# Patient Record
Sex: Female | Born: 1995 | Race: White | Hispanic: No | Marital: Single | Smoking: Never smoker
Health system: Southern US, Community
[De-identification: ages and names within clinical notes are randomized; demographics above are authoritative.]

## PROBLEM LIST (undated history)

## (undated) DIAGNOSIS — L709 Acne, unspecified: Secondary | ICD-10-CM

## (undated) HISTORY — DX: Acne, unspecified: L70.9

## (undated) HISTORY — PX: NO PAST SURGERIES: SHX2092

---
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
## 2017-03-19 ENCOUNTER — Ambulatory Visit (INDEPENDENT_AMBULATORY_CARE_PROVIDER_SITE_OTHER): Payer: BLUE CROSS/BLUE SHIELD | Admitting: Family Medicine

## 2017-03-19 ENCOUNTER — Encounter: Payer: Self-pay | Admitting: Family Medicine

## 2017-03-19 VITALS — BP 113/65 | HR 66 | Temp 98.2°F | Resp 14

## 2017-03-19 DIAGNOSIS — R110 Nausea: Secondary | ICD-10-CM

## 2017-03-19 DIAGNOSIS — J029 Acute pharyngitis, unspecified: Secondary | ICD-10-CM

## 2017-03-19 LAB — POCT INFLUENZA A/B
INFLUENZA B, POC: NEGATIVE
Influenza A, POC: NEGATIVE

## 2017-03-19 LAB — POCT RAPID STREP A (OFFICE): RAPID STREP A SCREEN: NEGATIVE

## 2017-03-19 MED ORDER — ONDANSETRON 4 MG PO TBDP: 4.0000 mg | ORAL_TABLET | Freq: Three times a day (TID) | ORAL | 0 refills | Status: AC | PRN

## 2017-03-19 NOTE — Progress Notes (Signed)
Patient presents today with symptoms of nausea for the last 2 days. She has also had a low-grade fever and sore throat. She denies any cough, myalgias, night sweats, severe headache, chest pain, shortness of breath, abdominal pain, pelvic pain, urinary symptoms. She has made herself throw up once. She denies any constipation or diarrhea. She has had some looser BMs. There have been some sick contacts with GI symptoms. Patient denies any symptoms suggestive of reflux however does usually drink lemonade. Denies any excessive caffeine use, alcohol use, smoking. Patient denies being sexually active. She is on oral birth control. She had her IUD removed.  ROS: Negative except mentioned above. Vitals as per Epic. GENERAL: NAD HEENT: mild pharyngeal erythema, no exudate, no erythema of TMs, no cervical LAD RESP: CTA B CARD: RRR ABD: +BS, positive bowel sounds, nontender, no organomegaly appreciated NEURO: CN II-XII grossly intact   Urine dip -negative leukocytes, negative nitrites, negative blood, negative glucose, negative protein, normal pH and SG, trace ketones   A/P: Viral Illness - rapid strep was negative, flu screen was negative, throat culture sent to lab, rest, hydration, seek medical attention if symptoms persist or worsen. No athletic activity or class if febrile. Zofran prescribed for nausea. Discussed decreasing lemonade intake. Encourage drinking more water and eating bland foods until GI symptoms resolved. Can try Zantac if symptoms suggestive of reflux. Will discuss above with trainer as well.

## 2017-03-23 ENCOUNTER — Other Ambulatory Visit: Payer: Self-pay | Admitting: Family Medicine

## 2017-03-23 LAB — CULTURE, GROUP A STREP

## 2017-03-23 MED ORDER — AMOXICILLIN 500 MG PO CAPS: 500.0000 mg | ORAL_CAPSULE | Freq: Two times a day (BID) | ORAL | 0 refills | Status: DC

## 2017-04-12 ENCOUNTER — Ambulatory Visit (INDEPENDENT_AMBULATORY_CARE_PROVIDER_SITE_OTHER): Payer: BLUE CROSS/BLUE SHIELD | Admitting: Family Medicine

## 2017-04-12 ENCOUNTER — Encounter: Payer: Self-pay | Admitting: Family Medicine

## 2017-04-12 VITALS — BP 114/70 | HR 98 | Temp 99.5°F | Resp 14

## 2017-04-12 DIAGNOSIS — J029 Acute pharyngitis, unspecified: Secondary | ICD-10-CM

## 2017-04-12 LAB — POCT RAPID STREP A (OFFICE): Rapid Strep A Screen: NEGATIVE

## 2017-04-12 MED ORDER — AMOXICILLIN 500 MG PO CAPS: 500.0000 mg | ORAL_CAPSULE | Freq: Two times a day (BID) | ORAL | 0 refills | Status: DC

## 2017-04-12 NOTE — Progress Notes (Signed)
Patient presents today with symptoms of sore throat 2 days. Patient states that her symptoms are 8 out of 10. She admits to subjective fever and headache. She denies any cough, nasal congestion, abdominal pain. She is on oral birth control and does not have her menstrual cycles anymore.  ROS: Negative except mentioned above. Vitals as per Epic. GENERAL: NAD HEENT: moderate pharyngeal erythema, no exudate, no erythema of TMs, mild cervical LAD RESP: CTA B CARD: RRR ABD: positive bowel sounds, nontender, no organomegaly appreciated NEURO: CN II-XII grossly intact   A/P: Pharyngitis - rapid strep test was negative, throat culture taken, amoxicillin started, rest, hydration, seek medical attention if symptoms persist or worsen, no athletic activity or class if febrile.

## 2017-04-14 LAB — CULTURE, GROUP A STREP: Strep A Culture: NEGATIVE

## 2017-04-16 ENCOUNTER — Ambulatory Visit: Payer: BLUE CROSS/BLUE SHIELD | Admitting: Family Medicine

## 2017-04-19 ENCOUNTER — Ambulatory Visit: Payer: BLUE CROSS/BLUE SHIELD | Admitting: Family Medicine

## 2017-04-20 ENCOUNTER — Ambulatory Visit (INDEPENDENT_AMBULATORY_CARE_PROVIDER_SITE_OTHER): Payer: BLUE CROSS/BLUE SHIELD | Admitting: Family Medicine

## 2017-04-20 ENCOUNTER — Encounter: Payer: Self-pay | Admitting: Family Medicine

## 2017-04-20 VITALS — BP 125/74 | HR 74 | Temp 98.7°F | Resp 14

## 2017-04-20 DIAGNOSIS — Z711 Person with feared health complaint in whom no diagnosis is made: Secondary | ICD-10-CM

## 2017-04-20 LAB — POCT URINE PREGNANCY: Preg Test, Ur: NEGATIVE

## 2017-04-20 NOTE — Progress Notes (Signed)
Patient presents today for STD check. Patient had unprotected sex with a partner approximately 2 months ago. She states that the partner has told her that he is negative for Chlamydia and Gonorrhea. Patient denies any symptoms. She is on oral birth control and does not have her menstrual cycles anymore. She denies any history of STDs in the past. She denies any vaginal discharge or genital lesions. 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 No active lesions at this time.  ROS: Negative except mentioned above. Vitals as per Epic. GENERAL: NAD HEENT: no pharyngeal erythema, no exudate, no erythema of TMs, no cervical LAD RESP: CTA B CARD: RRR ABD: positive bowel sounds, nontender, no rebound or guarding appreciated NEURO: CN II-XII grossly intact   Urine pregnancy - negative  A/P: STD Check - will do Urine Chlamydia/Gonorrhea/Trichomonas test, HIV test, RPR, declines herpes test given no lesions, encourage safe sex practices always, will follow-up with patient once results have been reviewed. Seek medical attention if any further questions or problems.

## 2017-04-21 LAB — HIV ANTIBODY (ROUTINE TESTING W REFLEX): HIV Screen 4th Generation wRfx: NONREACTIVE

## 2017-04-21 LAB — RPR: RPR Ser Ql: NONREACTIVE

## 2017-04-22 LAB — CHLAMYDIA/GONOCOCCUS/TRICHOMONAS, NAA
Chlamydia by NAA: NEGATIVE
GONOCOCCUS BY NAA: NEGATIVE
Trich vag by NAA: NEGATIVE

## 2017-08-24 ENCOUNTER — Ambulatory Visit (INDEPENDENT_AMBULATORY_CARE_PROVIDER_SITE_OTHER): Payer: BLUE CROSS/BLUE SHIELD | Admitting: Family Medicine

## 2017-08-24 ENCOUNTER — Encounter: Payer: Self-pay | Admitting: Family Medicine

## 2017-08-24 VITALS — Resp 14

## 2017-08-24 DIAGNOSIS — M25562 Pain in left knee: Secondary | ICD-10-CM

## 2017-08-24 MED ORDER — DICLOFENAC SODIUM 75 MG PO TBEC: 75.0000 mg | DELAYED_RELEASE_TABLET | Freq: Two times a day (BID) | ORAL | 0 refills | Status: AC

## 2017-08-24 NOTE — Progress Notes (Signed)
Patient presents today with symptoms of lateral left knee pain for the last 2 weeks or so. Patient states that the symptoms only occur when she dives for the ball a particular way. She has no pain with walking or running. She denies any swelling of the area or any obvious bruising. She has a history of MCL sprain in high school but no other injury of recent. She is not taking any medications for her symptoms.  ROS: Negative except mentioned above. Vitals as per Epic. GENERAL: NAD MSK: Left Knee - no effusion, full range of motion, no joint line tenderness, negative Lachman, negative McMurray, no varus or valgus instability, mild tenderness along distal IT band, no hamstring tenderness or loss of strength of hamstrings, nv intact  NEURO: CN II-XII grossly intact   A/P:Left lateral knee pain - patient appears to be tender along the distal IT band, will start patient on Voltaren, recommend evaluation by PT, modify activity to try not to dive during practices, can try compression sleeve/padding, if symptoms do persist or worsen will consider doing imaging. Above discussed with the athletic trainer.

## 2017-11-10 ENCOUNTER — Ambulatory Visit
Admission: RE | Admit: 2017-11-10 | Discharge: 2017-11-10 | Disposition: A | Discharge status: Home / Self Care | Payer: BLUE CROSS/BLUE SHIELD | Source: Ambulatory Visit | Attending: Family Medicine | Admitting: Family Medicine

## 2017-11-10 ENCOUNTER — Other Ambulatory Visit: Payer: Self-pay | Admitting: Family Medicine

## 2017-11-10 DIAGNOSIS — M79662 Pain in left lower leg: Secondary | ICD-10-CM | POA: Insufficient documentation

## 2017-11-10 DIAGNOSIS — M79661 Pain in right lower leg: Secondary | ICD-10-CM | POA: Insufficient documentation

## 2017-11-10 DIAGNOSIS — R52 Pain, unspecified: Secondary | ICD-10-CM

## 2017-11-11 ENCOUNTER — Ambulatory Visit (INDEPENDENT_AMBULATORY_CARE_PROVIDER_SITE_OTHER): Payer: BLUE CROSS/BLUE SHIELD | Admitting: Family Medicine

## 2017-11-11 ENCOUNTER — Encounter: Payer: Self-pay | Admitting: Family Medicine

## 2017-11-11 VITALS — Temp 98.3°F

## 2017-11-11 DIAGNOSIS — S86899A Other injury of other muscle(s) and tendon(s) at lower leg level, unspecified leg, initial encounter: Secondary | ICD-10-CM

## 2017-11-11 MED ORDER — NAPROXEN 500 MG PO TABS: 500.0000 mg | ORAL_TABLET | Freq: Two times a day (BID) | ORAL | 0 refills | Status: AC

## 2017-11-11 NOTE — Progress Notes (Signed)
Patient presents today with symptoms of bilateral lower leg pain.  Patient states that she has had the symptoms for about the last 4 weeks.  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  She contributes her current discomfort to jumping during volleyball.  The area of discomfort is in the same location on both lower extremities.  Sometimes the right leg is more uncomfortable than the left.  She denies any significant running that they are doing.  She denies any ecchymosis or swelling of the area.  She rates her pain a 3-4 out of 10.  She states that after practice or game she has discomfort which then resolves after sitting in a ice bath.  She denies any pain at rest.  She denies having any limp.  Patient denies any history of stress injury in the past.  She does take a vitamin D supplement daily.  She is unsure of her last vitamin D level.  She admits to normal menstrual cycles.  She denies any significant weight changes in the last 2 months.  She admits to stretching before and after activity.  ROS: Negative except mentioned above. Vitals as per Epic. GENERAL: NAD MSK: Lower extremities -no significant ecchymosis or swelling noted, mild tenderness along the entire medial border of the tibia, maximum level of tenderness at the mid tibia, full range of motion, positive hop test on left, N/V intact, normal gait NEURO: CN II-XII grossly intact    A/P: Bilateral lower extremity pain -discussed medial tibial stress syndrome, x-rays were negative for stress fracture, would recommend that patient decrease level of practice and play time, modify activities as discussed with patient and athletic trainer, patient is to inform athletic trainer if symptoms continue to get worse, NSAIDs as needed, also encouraged wearing walking boot if needed between games this weekend, patient has not eaten today so will return in the next 1 to 2 weeks to have vitamin D level checked, follow-up sooner if  needed.

## 2017-12-06 ENCOUNTER — Ambulatory Visit (INDEPENDENT_AMBULATORY_CARE_PROVIDER_SITE_OTHER): Payer: BLUE CROSS/BLUE SHIELD | Admitting: Family Medicine

## 2017-12-06 ENCOUNTER — Encounter: Payer: Self-pay | Admitting: Family Medicine

## 2017-12-06 VITALS — BP 106/71 | HR 67 | Temp 98.6°F | Resp 14

## 2017-12-06 DIAGNOSIS — S99922A Unspecified injury of left foot, initial encounter: Secondary | ICD-10-CM
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
## 2018-01-07 ENCOUNTER — Ambulatory Visit (INDEPENDENT_AMBULATORY_CARE_PROVIDER_SITE_OTHER): Payer: BLUE CROSS/BLUE SHIELD | Admitting: Family Medicine

## 2018-01-07 VITALS — BP 111/70 | HR 93 | Temp 98.7°F | Resp 14

## 2018-01-07 DIAGNOSIS — S060X0D Concussion without loss of consciousness, subsequent encounter: Secondary | ICD-10-CM
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
## 2018-01-13 ENCOUNTER — Ambulatory Visit (INDEPENDENT_AMBULATORY_CARE_PROVIDER_SITE_OTHER): Payer: PRIVATE HEALTH INSURANCE | Admitting: Family Medicine

## 2018-01-13 VITALS — BP 124/83 | HR 111 | Temp 98.3°F | Resp 14

## 2018-01-13 DIAGNOSIS — J069 Acute upper respiratory infection, unspecified: Secondary | ICD-10-CM

## 2018-01-13 MED ORDER — AZITHROMYCIN 250 MG PO TABS: ORAL_TABLET | ORAL | 0 refills | Status: DC

## 2018-01-14 NOTE — Progress Notes (Signed)
Patient presents today with symptoms of cough for the last week.  Patient denies any nausea, vomiting, diarrhea, headache, sore throat, chest pain, shortness of breath.  Patient states that the mucus is yellow-green in color.  She has some nasal congestion.  She denies any myalgias, chills, fever.  She denies any asthma or smoking history.  Teammate was diagnosed with pneumonia a few weeks ago.  ROS: Negative except mentioned above. Vitals as per Epic. GENERAL: NAD HEENT: no pharyngeal erythema, no exudate, no erythema of TMs, no cervical LAD RESP: CTA B CARD: RRR NEURO: CN II-XII grossly intact   A/P: URI -will treat patient with Z-Pak, Delsym as needed for cough, Claritin as needed for any postnasal drip, rest, hydration, no athletic activity if febrile, seek medical attention if symptoms persist or worsen as discussed.

## 2018-03-01 ENCOUNTER — Other Ambulatory Visit: Payer: Self-pay | Admitting: Family Medicine

## 2018-03-01 ENCOUNTER — Ambulatory Visit (INDEPENDENT_AMBULATORY_CARE_PROVIDER_SITE_OTHER): Payer: PRIVATE HEALTH INSURANCE | Admitting: Family Medicine

## 2018-03-01 VITALS — BP 123/72 | HR 73 | Temp 98.7°F | Resp 14

## 2018-03-01 DIAGNOSIS — R61 Generalized hyperhidrosis: Secondary | ICD-10-CM

## 2018-03-01 DIAGNOSIS — R0981 Nasal congestion: Secondary | ICD-10-CM

## 2018-03-01 DIAGNOSIS — J029 Acute pharyngitis, unspecified: Secondary | ICD-10-CM

## 2018-03-01 DIAGNOSIS — R51 Headache: Secondary | ICD-10-CM

## 2018-03-01 MED ORDER — AMOXICILLIN 500 MG PO CAPS: 500.0000 mg | ORAL_CAPSULE | Freq: Two times a day (BID) | ORAL | 0 refills | Status: AC

## 2018-03-01 NOTE — Progress Notes (Signed)
Patient presents today with symptoms of nasal congestion, sore throat, night sweats, headache.  Patient states that her symptoms started yesterday.  She denies any cough, abdominal pain, chest pain, nausea, vomiting, diarrhea, severe headache.  She has taken Tylenol and ibuprofen this morning.  She does have sick contacts.  She denies getting the flu vaccination this year.   ROS: Negative except mentioned above. Vitals as per Epic.  GENERAL: NAD HEENT: mild pharyngeal erythema, no exudate, no erythema of TMs, shotty cervical LAD RESP: CTA B CARD: RRR ABD: +BS, NT, no rebound or guarding appreciated, no organomegly appreciated, no flank tenderness  NEURO: CN II-XII grossly intact   A/P: Viral Illness -rapid strep test and flu screen were negative in the office, CBC and Monospot test were drawn, Tylenol/Ibuprofen as needed, rest, hydration, oral antihistamine as needed, cough suppressant as needed, no athletic activity or class until afebrile for 24 hours without taking fever lowering medication, seek medical attention if symptoms persist or worsen as discussed, encourage patient to inform academic advisor and professors, will inform athletic trainer.

## 2018-03-02 LAB — CBC WITH DIFFERENTIAL/PLATELET
BASOS ABS: 0 10*3/uL (ref 0.0–0.2)
Basos: 0 %
EOS (ABSOLUTE): 0.1 10*3/uL (ref 0.0–0.4)
Eos: 1 %
HEMOGLOBIN: 12.6 g/dL (ref 11.1–15.9)
Hematocrit: 37.9 % (ref 34.0–46.6)
Immature Grans (Abs): 0 10*3/uL (ref 0.0–0.1)
Immature Granulocytes: 0 %
Lymphocytes Absolute: 1.7 10*3/uL (ref 0.7–3.1)
Lymphs: 16 %
MCH: 29.9 pg (ref 26.6–33.0)
MCHC: 33.2 g/dL (ref 31.5–35.7)
MCV: 90 fL (ref 79–97)
MONOCYTES: 5 %
Monocytes Absolute: 0.6 10*3/uL (ref 0.1–0.9)
NEUTROS PCT: 78 %
Neutrophils Absolute: 8.3 10*3/uL — ABNORMAL HIGH (ref 1.4–7.0)
Platelets: 222 10*3/uL (ref 150–450)
RBC: 4.22 x10E6/uL (ref 3.77–5.28)
RDW: 12.1 % (ref 11.7–15.4)
WBC: 10.7 10*3/uL (ref 3.4–10.8)

## 2018-03-02 LAB — MONONUCLEOSIS SCREEN: MONO SCREEN: NEGATIVE

## 2018-09-28 ENCOUNTER — Other Ambulatory Visit: Payer: Self-pay

## 2018-09-28 DIAGNOSIS — Z20822 Contact with and (suspected) exposure to covid-19: Secondary | ICD-10-CM

## 2018-09-30 LAB — NOVEL CORONAVIRUS, NAA: SARS-CoV-2, NAA: NOT DETECTED

## 2018-10-12 ENCOUNTER — Other Ambulatory Visit: Payer: Self-pay

## 2018-10-12 DIAGNOSIS — Z20822 Contact with and (suspected) exposure to covid-19: Secondary | ICD-10-CM

## 2018-10-14 LAB — NOVEL CORONAVIRUS, NAA: SARS-CoV-2, NAA: NOT DETECTED

## 2019-03-26 ENCOUNTER — Other Ambulatory Visit: Payer: Self-pay

## 2019-03-26 ENCOUNTER — Ambulatory Visit: Payer: PRIVATE HEALTH INSURANCE | Attending: Internal Medicine

## 2019-03-26 DIAGNOSIS — Z23 Encounter for immunization: Secondary | ICD-10-CM

## 2019-03-26 NOTE — Progress Notes (Signed)
   Covid-[facility] Vaccination [facility]  Name:  Amiree No    MRN: [id number] DOB: Dec 05, 1995  03/26/2019  Ms. Rodger was observed post Covid-19 immunization for 15 minutes without incident. She was provided with Vaccine Information Sheet and instruction to access the V-Safe system.   Ms. Feely was instructed to call 911 with any severe reactions post vaccine: Marland Kitchen Difficulty breathing  . Swelling of face and throat  . A fast heartbeat  . A bad rash all over body  . Dizziness and weakness   Immunizations Administered    Name Date Dose VIS Date Route   Pfizer COVID-19 Vaccine 03/26/2019  7:04 PM 0.3 mL 12/16/2018 Intramuscular   Manufacturer: [employer], [employer]   Lot: [id number]   NDC: [address]-7

## 2019-04-23 ENCOUNTER — Ambulatory Visit: Payer: PRIVATE HEALTH INSURANCE | Attending: Internal Medicine

## 2019-04-23 DIAGNOSIS — Z23 Encounter for immunization: Secondary | ICD-10-CM

## 2019-04-23 NOTE — Progress Notes (Signed)
   Covid-19 Vaccination Clinic  Name:  Mahiya Kercheval    MRN: [id number] DOB: 12-Sep-1995  04/23/2019  Ms. Dais was observed post Covid-19 immunization for 15 minutes without incident. She was provided with Vaccine Information Sheet and instruction to access the V-Safe system.   Ms. Mulligan was instructed to call 911 with any severe reactions post vaccine: Marland Kitchen Difficulty breathing  . Swelling of face and throat  . A fast heartbeat  . A bad rash all over body  . Dizziness and weakness   Immunizations Administered    Name Date Dose VIS Date Route   Pfizer COVID-19 Vaccine 04/23/2019  5:51 PM 0.3 mL 12/16/2018 Intramuscular   Manufacturer: [employer], [employer]   Lot: [id number]   NDC: [address]-0

## 2019-06-12 IMAGING — MR MR LUMBAR SPINE W/O CM
5 series · 39 of 48 positions shown · non-contrast
Comparison: Lumbar spine x-rays dated October 13, 2016.

CLINICAL DATA: Chronic low back pain radiating into the right leg.

EXAM:
MRI LUMBAR SPINE WITHOUT CONTRAST
TECHNIQUE: Multiplanar, multisequence MR imaging of the lumbar spine was
performed. No intravenous contrast was administered.

[Series 2: T2 · sagittal · 4.0mm · 0.81mm/px · 6 of 15 slices shown (1 of 2)]
[im 1/15]
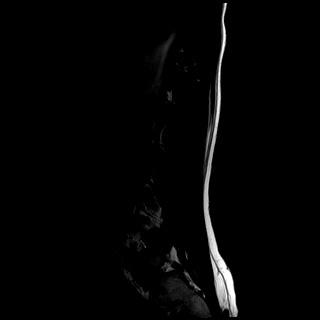
[im 3/15]
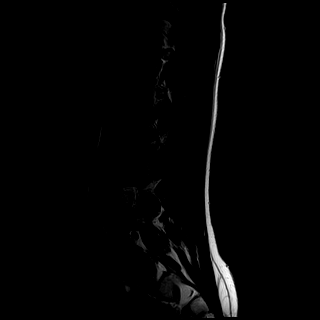
[im 6/15]
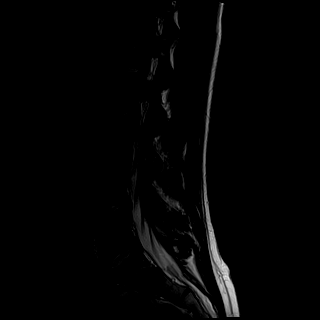
[im 9/15]
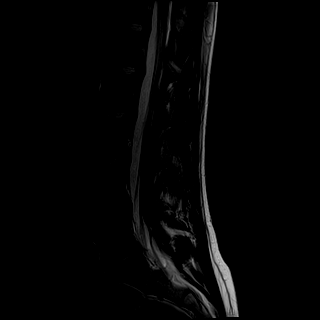
[im 12/15]
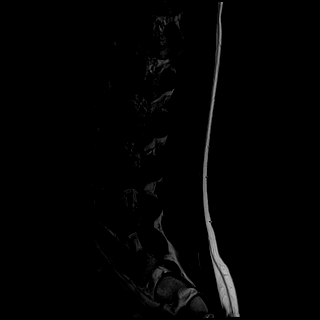
[im 15/15]
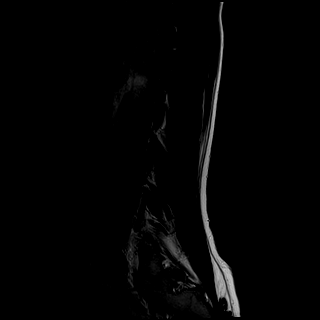

[Series 3: T1 · sagittal · 4.0mm · 0.81mm/px · 6 of 15 slices shown (1 of 2)]
[im 1/15]
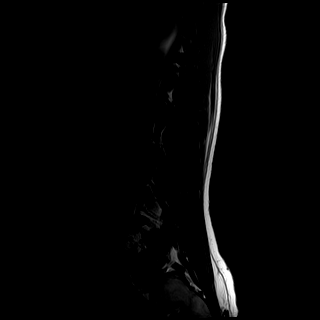
[im 3/15]
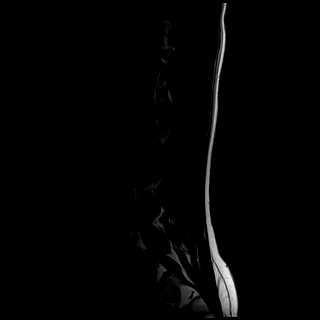
[im 6/15]
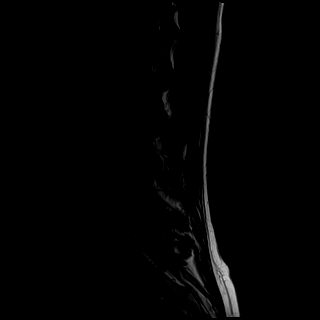
[im 9/15]
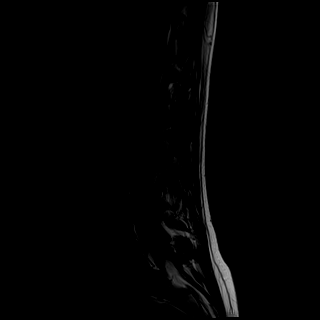
[im 12/15]
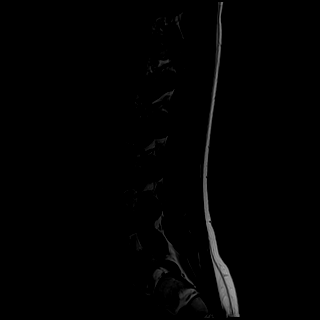
[im 15/15]
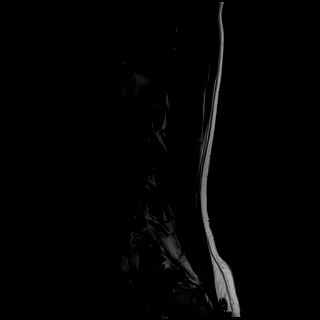

[Series 5: T2 · axial · 4.0mm · 0.78mm/px · z∈[-157,+73]mm · 12 of 41 slices shown (2 of 2)]
[im 1/41]
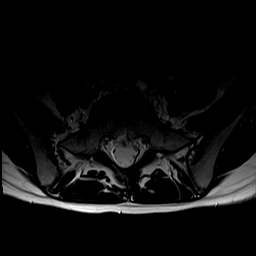
[im 3/41]
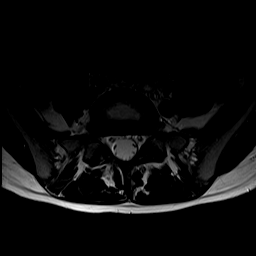
[im 6/41]
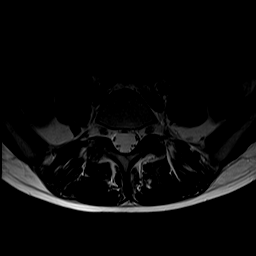
[im 9/41]
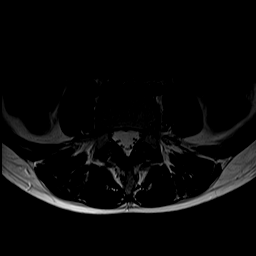
[im 12/41]
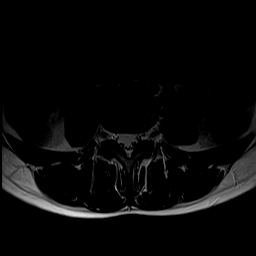
[im 15/41]
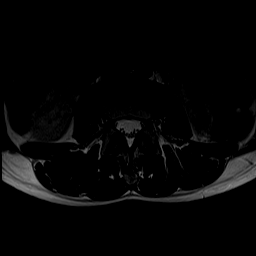
[im 18/41]
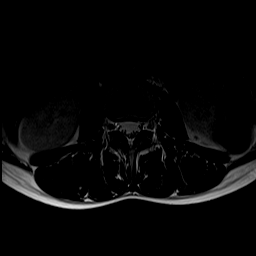
[im 21/41]
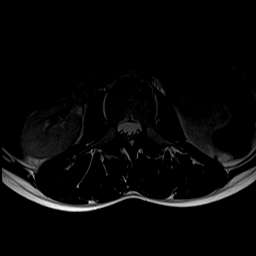
[im 23/41]
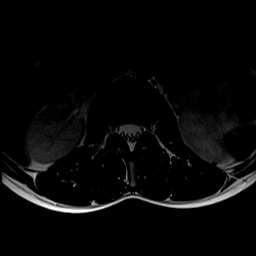
[im 29/41]
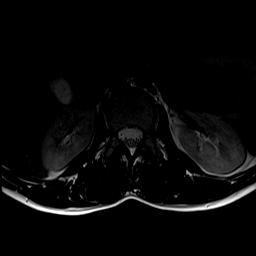
[im 35/41]
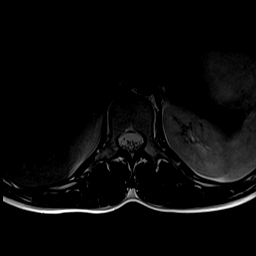
[im 41/41]
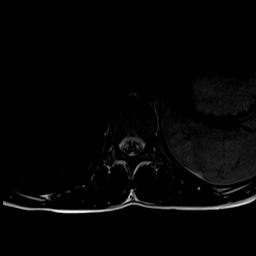

[Series 6: T1 · axial · 4.0mm · 0.39mm/px · z∈[-157,+73]mm · 9 of 41 slices shown (2 of 2)]
[im 1/41]
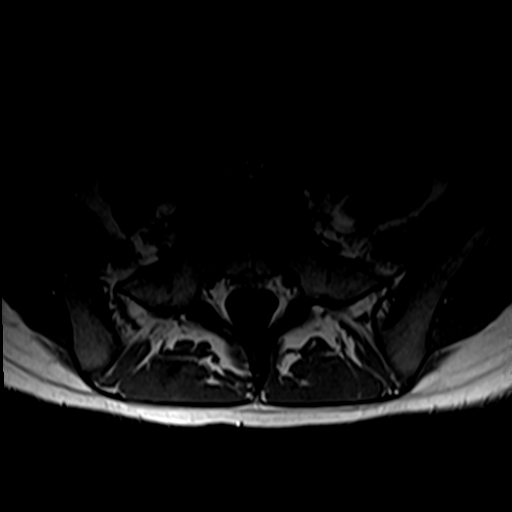
[im 6/41]
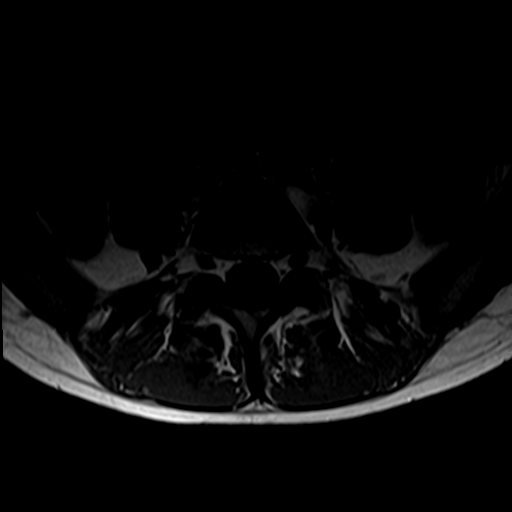
[im 12/41]
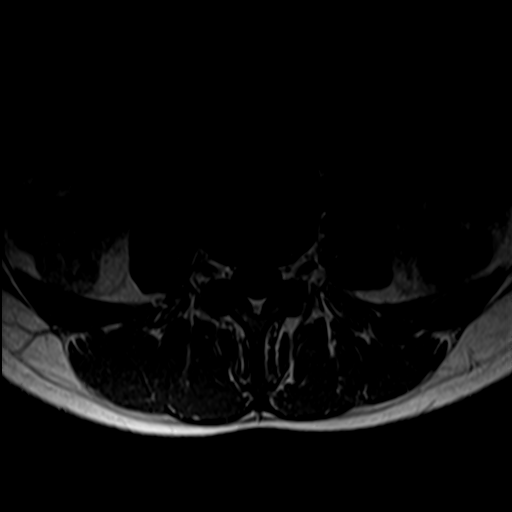
[im 18/41]
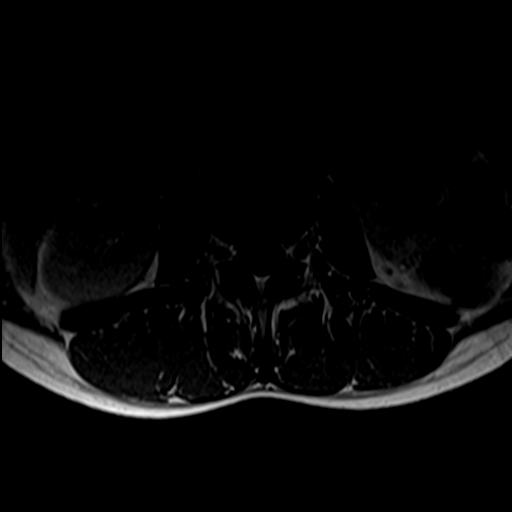
[im 21/41]
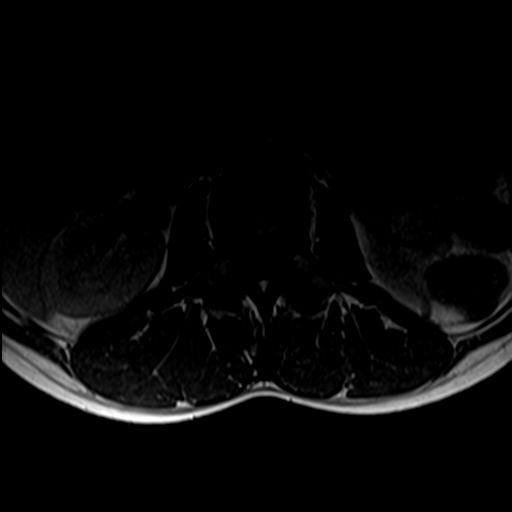
[im 23/41]
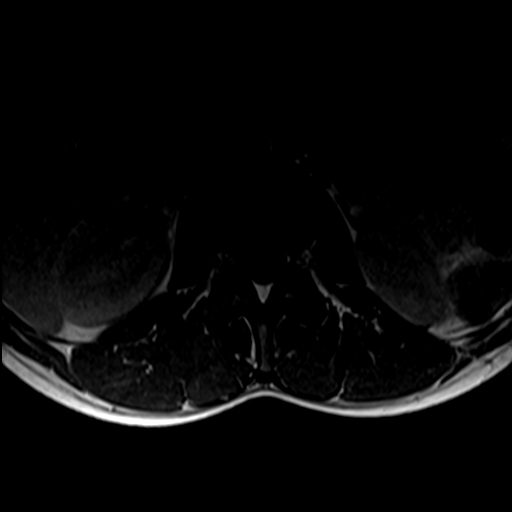
[im 29/41]
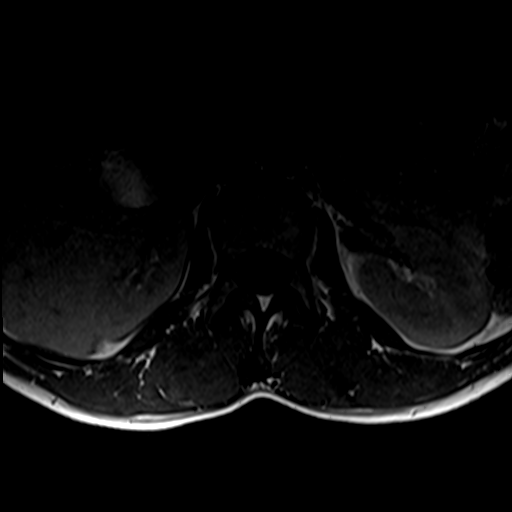
[im 35/41]
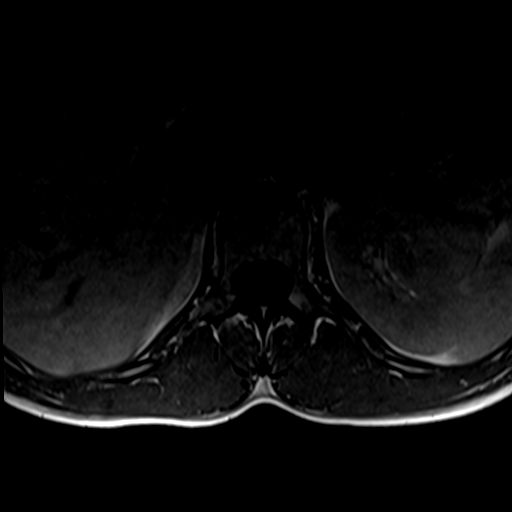
[im 41/41]
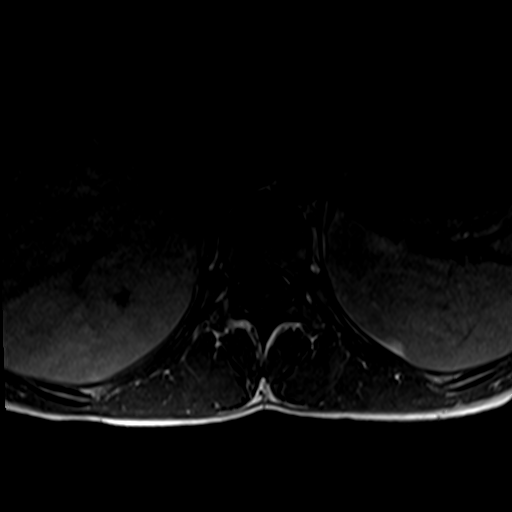

[Series 7: STIR · sagittal · 4.0mm · 1.02mm/px · 6 of 15 slices shown]
[im 1/15]
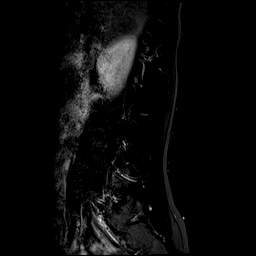
[im 3/15]
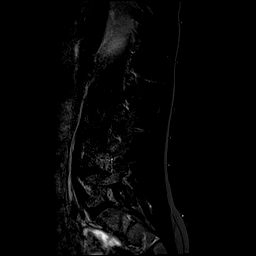
[im 6/15]
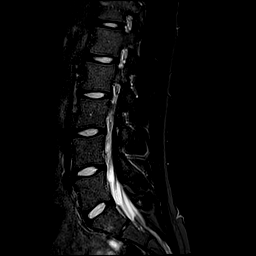
[im 9/15]
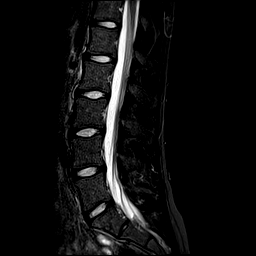
[im 12/15]
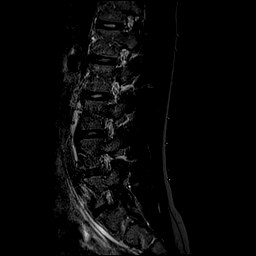
[im 15/15]
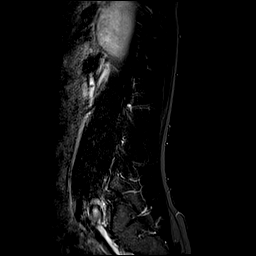

[39 of 48 positions shown; findings below may reference images not displayed]

FINDINGS: Segmentation:  Standard.

Alignment:  Physiologic.

Vertebrae:  No fracture, evidence of discitis, or bone lesion.

Conus medullaris: Extends to the L1 level and appears normal.

Paraspinal and other soft tissues: Negative.

Disc levels:

T12-L1:  Normal.

L1-L2:  Normal.

L2-L3:  Normal.

L3-L4:  Normal.

L4-L5: Mild left facet arthropathy. No spinal canal or
neuroforaminal stenosis.

L5-S1: Mild left facet arthropathy. No spinal canal or
neuroforaminal stenosis.
IMPRESSION: 1. Mild left-sided facet arthropathy at L4-L5 and L5-S1. Otherwise
normal lumbar spine MRI. No significant spinal canal or
neuroforaminal stenosis at any level.
